# Patient Record
Sex: Male | Born: 1985 | Hispanic: No | Marital: Single | Smoking: Current every day smoker
Health system: Southern US, Community
[De-identification: ages and names within clinical notes are randomized; demographics above are authoritative.]

## PROBLEM LIST (undated history)

## (undated) HISTORY — PX: DENTAL SURGERY: SHX609

---

## 2016-11-11 ENCOUNTER — Encounter: Payer: Self-pay | Admitting: Emergency Medicine

## 2016-11-11 ENCOUNTER — Emergency Department: Payer: Self-pay

## 2016-11-11 ENCOUNTER — Emergency Department
Admission: EM | Admit: 2016-11-11 | Discharge: 2016-11-11 | Disposition: A | Discharge status: Home / Self Care | Payer: Self-pay | Attending: Emergency Medicine | Admitting: Emergency Medicine

## 2016-11-11 DIAGNOSIS — M25512 Pain in left shoulder: Secondary | ICD-10-CM

## 2016-11-11 DIAGNOSIS — Y92410 Unspecified street and highway as the place of occurrence of the external cause: Secondary | ICD-10-CM | POA: Insufficient documentation

## 2016-11-11 DIAGNOSIS — Y999 Unspecified external cause status: Secondary | ICD-10-CM | POA: Insufficient documentation

## 2016-11-11 DIAGNOSIS — F17210 Nicotine dependence, cigarettes, uncomplicated: Secondary | ICD-10-CM | POA: Insufficient documentation

## 2016-11-11 DIAGNOSIS — S46911A Strain of unspecified muscle, fascia and tendon at shoulder and upper arm level, right arm, initial encounter: Secondary | ICD-10-CM | POA: Insufficient documentation

## 2016-11-11 DIAGNOSIS — Y9355 Activity, bike riding: Secondary | ICD-10-CM | POA: Insufficient documentation

## 2016-11-11 MED ORDER — NAPROXEN 500 MG PO TABS
500.0000 mg | ORAL_TABLET | Freq: Once | ORAL | Status: AC
  Administered 2016-11-11: 500 mg via ORAL
  Filled 2016-11-11: qty 1

## 2016-11-11 MED ORDER — NAPROXEN 500 MG PO TABS: 500.0000 mg | ORAL_TABLET | Freq: Two times a day (BID) | ORAL | 0 refills | Status: AC

## 2016-11-11 NOTE — Discharge Instructions (Signed)
Your exam and x-ray are negative. You may have a shoulder strain and mild AC separation. Apply ice and take the prescription med as directed. Follow-up with Dr. Hyacinth MeekerMiller for continued symptoms.
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
## 2021-05-16 ENCOUNTER — Other Ambulatory Visit: Payer: Self-pay

## 2021-05-16 ENCOUNTER — Emergency Department: Payer: Medicaid Other

## 2021-05-16 ENCOUNTER — Encounter: Payer: Self-pay | Admitting: Emergency Medicine

## 2021-05-16 ENCOUNTER — Emergency Department
Admission: EM | Admit: 2021-05-16 | Discharge: 2021-05-16 | Disposition: A | Discharge status: Home / Self Care | Payer: Medicaid Other | Attending: Emergency Medicine | Admitting: Emergency Medicine

## 2021-05-16 DIAGNOSIS — W228XXA Striking against or struck by other objects, initial encounter: Secondary | ICD-10-CM | POA: Insufficient documentation

## 2021-05-16 DIAGNOSIS — S6992XA Unspecified injury of left wrist, hand and finger(s), initial encounter: Secondary | ICD-10-CM | POA: Diagnosis present

## 2021-05-16 DIAGNOSIS — S60222A Contusion of left hand, initial encounter: Secondary | ICD-10-CM | POA: Insufficient documentation

## 2021-05-16 NOTE — ED Provider Notes (Signed)
[facility] Provider Note    Event Date/Time   First MD Initiated Contact with Patient 05/16/21 1749     (approximate)   History   Hand Injury   HPI  Douglas Palmer is a 36 y.o. male presents emergency department complaining of a left hand injury.  Patient states that he punched the doorway 2 days ago.  Patient is complaining of left hand pain.  No numbness or tingling.      Physical Exam   Triage Vital Signs: ED Triage Vitals  Enc Vitals Group     BP 05/16/21 1511 122/68     Pulse Rate 05/16/21 1511 88     Resp 05/16/21 1511 16     Temp 05/16/21 1511 97.7 F (36.5 C)     Temp Source 05/16/21 1511 Oral     SpO2 05/16/21 1511 100 %     Weight 05/16/21 1509 149 lb 0.5 oz (67.6 kg)     Height 05/16/21 1509 5\' 7"  (1.702 m)     Head Circumference --      Peak Flow --      Pain Score 05/16/21 1509 7     Pain Loc --      Pain Edu? --      Excl. in GC? --     Most recent vital signs: Vitals:   05/16/21 1511  BP: 122/68  Pulse: 88  Resp: 16  Temp: 97.7 F (36.5 C)  SpO2: 100%     General: Awake, no distress.   CV:  Good peripheral perfusion.   Resp:  Normal effort.   Abd:  No distention.   Other:  Left hand has some bruising and tenderness along the fourth and fifth metacarpals, neurovascular is intact   ED Results / Procedures / Treatments   Labs (all labs ordered are listed, but only abnormal results are displayed) Labs Reviewed - No data to display   EKG     RADIOLOGY  X-ray of the left hand   PROCEDURES:  Critical Care performed: No  Procedures   MEDICATIONS ORDERED IN ED: Medications - No data to display   IMPRESSION / MDM / ASSESSMENT AND PLAN / ED COURSE  I reviewed the triage vital signs and the nursing notes.                              Differential diagnosis includes, but is not limited to, boxer's fracture, left hand contusion, dislocation  Patient is a 36 year old male presents emergency  department left hand injury after punching a door frame.  X-ray of the left hand does not show a fracture.  This was confirmed by radiology  Due to the area just being bruised, the patient can just apply ice to the left hand.  Take over-the-counter Tylenol or ibuprofen.  Asked for a work note which was provided.  He is to follow-up with orthopedics if not improving in 1 week.  Return if worsening.  Discharged in stable condition.          FINAL CLINICAL IMPRESSION(S) / ED DIAGNOSES   Final diagnoses:  Contusion of left hand, initial encounter     Rx / DC Orders   ED Discharge Orders     None        Note:  This document was prepared using Dragon voice recognition software and may include unintentional dictation errors.    31, PA-C 05/16/21 1757  Georga Hacking, MD 05/16/21 Jerene Bears

## 2021-05-16 NOTE — Discharge Instructions (Signed)
Follow-up with [facility] orthopedics if not improving in 1 week.  Please call for appointment.  Apply ice to the left hand.  Take Tylenol or ibuprofen for pain as needed.  Return if worsening

## 2021-05-16 NOTE — ED Triage Notes (Signed)
Punched door frame.  C/O left hand pain.

## 2022-05-02 ENCOUNTER — Emergency Department
Admission: EM | Admit: 2022-05-02 | Discharge: 2022-05-02 | Disposition: A | Discharge status: Home / Self Care | Payer: Medicaid Other | Attending: Emergency Medicine | Admitting: Emergency Medicine

## 2022-05-02 ENCOUNTER — Other Ambulatory Visit: Payer: Self-pay

## 2022-05-02 ENCOUNTER — Emergency Department: Payer: Medicaid Other

## 2022-05-02 DIAGNOSIS — M7918 Myalgia, other site: Secondary | ICD-10-CM

## 2022-05-02 DIAGNOSIS — M25551 Pain in right hip: Secondary | ICD-10-CM | POA: Insufficient documentation
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
## 2022-05-02 NOTE — Discharge Instructions (Signed)
Take acetaminophen 650 mg and ibuprofen 400 mg every 6 hours for pain.  Take with food. Call Dr Hyacinth Meeker if hip pain persists.   Thank you for choosing [place] for your health care today!  Please see your primary doctor this week for a follow up appointment.   Sometimes, in the early stages of certain disease courses it is difficult to detect in the emergency department evaluation -- so, it is important that you continue to monitor your symptoms and call your doctor right away or return to the emergency department if you develop any new or worsening symptoms.  Please go to the following website to schedule new (and existing) patient appointments:   [email or web address]  If you do not have a primary doctor try calling the following clinics to establish care:  If you have insurance:  [facility] [id number] [address] [place] [place] [address]   [facility]  [phone] [address] [place] [place] [address]   If you do not have insurance:  [facility]  [phone] [address], [place] [address]   The following is another list of primary care offices in the area who are accepting new patients at this time.  Please reach out to one of them directly and let them know you would like to schedule an appointment to follow up on an Emergency Department visit, and/or to establish a new primary care provider (PCP).  There are likely other primary care clinics in the are who are accepting new patients, but this is an excellent place to start:  [facility] Lead physician: Dr Shirlee Latch [address] [place], [place] [address] [phone]  [facility] Lead Physician: Dr Alba Cory [address], [place], [place] [address] [phone]  [facility]  Lead Physician: Dr Olevia Perches [address] [place], [place] [address] [phone]  [facility] Lead Physician: Dr Sofie Hartigan [address] [place], [place], [place] [address] [phone]  [facility] at [facility] Lead Physician: Dr Bari Edward [address] [place], [place] [address] [phone]   It was my pleasure to care for you today.   Daneil Dan Modesto Charon, MD

## 2022-06-18 ENCOUNTER — Other Ambulatory Visit: Payer: Self-pay

## 2022-06-18 ENCOUNTER — Emergency Department
Admission: EM | Admit: 2022-06-18 | Discharge: 2022-06-18 | Disposition: A | Discharge status: Home / Self Care | Payer: Self-pay | Attending: Emergency Medicine | Admitting: Emergency Medicine

## 2022-06-18 DIAGNOSIS — K029 Dental caries, unspecified: Secondary | ICD-10-CM | POA: Insufficient documentation

## 2022-06-18 MED ORDER — AMOXICILLIN-POT CLAVULANATE 875-125 MG PO TABS
1.0000 | ORAL_TABLET | Freq: Once | ORAL | Status: AC
  Administered 2022-06-18: 1 via ORAL
  Filled 2022-06-18: qty 1

## 2022-06-18 MED ORDER — AMOXICILLIN-POT CLAVULANATE 875-125 MG PO TABS: 1.0000 | ORAL_TABLET | Freq: Two times a day (BID) | ORAL | 0 refills | Status: AC

## 2022-06-18 MED ORDER — LIDOCAINE VISCOUS HCL 2 % MT SOLN
15.0000 mL | Freq: Once | OROMUCOSAL | Status: AC
  Administered 2022-06-18: 15 mL via OROMUCOSAL
  Filled 2022-06-18: qty 15

## 2022-06-18 MED ORDER — TRAMADOL HCL 50 MG PO TABS
50.0000 mg | ORAL_TABLET | Freq: Once | ORAL | Status: AC
  Administered 2022-06-18: 50 mg via ORAL
  Filled 2022-06-18: qty 1

## 2022-06-18 NOTE — ED Triage Notes (Signed)
Pt presents to ED with c/o dental pain for the past 3 days. Pt states he broke a tooth on the L side. Pt denies fever or chills.

## 2022-06-18 NOTE — ED Provider Notes (Signed)
[facility] Emergency Department Provider Note     Event Date/Time   First MD Initiated Contact with Patient 06/18/22 1252     (approximate)   History   Dental Pain   HPI  Douglas Palmer is a 37 y.o. male with no medical history, presents to the ED for evaluation of 3 days of dental pain.  Patient with admitted poor dentition and [place] care use generally through the upper and lower jaw, presents noting pain localized to the anterior lower incisors.  Patient also notes some tenderness to his chin.  He denies any superficial or skin lesions.  He also denies any fevers or chills.  He denies any spontaneous purulent drainage.   Physical Exam   Triage Vital Signs: ED Triage Vitals  Enc Vitals Group     BP 06/18/22 1217 (!) 123/98     Pulse Rate 06/18/22 1217 (!) 109     Resp 06/18/22 1217 17     Temp 06/18/22 1217 (!) 97.5 F (36.4 C)     Temp Source 06/18/22 1217 Oral     SpO2 06/18/22 1217 99 %     Weight 06/18/22 1248 149 lb 14.6 oz (68 kg)     Height 06/18/22 1248 5' 8"$  (1.727 m)     Head Circumference --      Peak Flow --      Pain Score 06/18/22 1212 10     Pain Loc --      Pain Edu? --      Excl. in [place]? --     Most recent vital signs: Vitals:   06/18/22 1217  BP: (!) 123/98  Pulse: (!) 109  Resp: 17  Temp: (!) 97.5 F (36.4 C)  SpO2: 99%    General Awake, no distress. NAD HEENT NCAT. PERRL. EOMI. No rhinorrhea. Mucous membranes are moist.  Uvula is midline and tonsils are flat.  Patient with generalized arrested caries, necrotic gingivitis, and multiple teeth with decay to the dentin.  No brawny sublingual edema is appreciated.  No focal gum swelling or point tenderness noted.  Firmness noted to the chin without appreciable fluctuance or buccal mucosa fluctuance appreciated. CV:  Good peripheral perfusion.  RESP:  Normal effort.  ABD:  No distention.    ED Results / Procedures / Treatments   Labs (all labs ordered are  listed, but only abnormal results are displayed) Labs Reviewed - No data to display   EKG   RADIOLOGY  No results found.   PROCEDURES:  Critical Care performed: No  Procedures   MEDICATIONS ORDERED IN ED: Medications  lidocaine (XYLOCAINE) 2 % viscous mouth solution 15 mL (15 mLs Mouth/Throat Given 06/18/22 1326)  amoxicillin-clavulanate (AUGMENTIN) 875-125 MG per tablet 1 tablet (1 tablet Oral Given 06/18/22 1340)  traMADol (ULTRAM) tablet 50 mg (50 mg Oral Given 06/18/22 1326)     IMPRESSION / MDM / ASSESSMENT AND PLAN / ED COURSE  I reviewed the triage vital signs and the nursing notes.                              Differential diagnosis includes, but is not limited to, dental caries, dental infection, dental abscess, facial abscess, Ludwig's angina  Patient's presentation is most consistent with acute complicated illness / injury requiring diagnostic workup.  Patient's diagnosis is consistent with dental infection secondary to dental caries. Patient will be discharged home with prescriptions for Augmentin. Patient  is to follow up with a local dental provider as needed or otherwise directed. Patient is given ED precautions to return to the ED for any worsening or new symptoms.     FINAL CLINICAL IMPRESSION(S) / ED DIAGNOSES   Final diagnoses:  Pain due to dental caries     Rx / DC Orders   ED Discharge Orders          Ordered    amoxicillin-clavulanate (AUGMENTIN) 875-125 MG tablet  2 times daily        06/18/22 1309             Note:  This document was prepared using Dragon voice recognition software and may include unintentional dictation errors.    Melvenia Needles, PA-C 06/18/22 1919    Lucillie Garfinkel, MD 06/19/22 [phone]

## 2022-06-18 NOTE — Discharge Instructions (Signed)
Antibiotic as directed until all pills are completed.  Follow-up with one of the listed dental providers for further treatment including dental extractions.  OPTIONS FOR DENTAL FOLLOW UP CARE  [email or web address] [phone])  [facility] [phone])  [place] [phone])  [place] [phone])  [facility] [phone]) This clinic caters to the indigent population and is on a lottery system. Location: [employer] [facility], [place], [place], [place] [facility] Hours: Wednesdays from 6pm - 9pm, patients seen by a lottery system. For dates, call or go to [email or web address] Services: Cleanings, fillings and simple extractions. Payment Options: DENTAL WORK IS FREE OF CHARGE. Bring proof of income or support. Best way to get seen: Arrive at 5:15 pm - this is a lottery, NOT first come/first serve, so arriving earlier will not increase your chances of being seen.     [facility] [phone] Select option 1 for emergencies   Location: [facility], [place], [address] [place] Clinic Hours: No walk-ins accepted - call the day before to schedule an appointment. Check in times are 9:30 am and 1:30 pm. Services: Simple extractions, temporary fillings, pulpectomy/pulp debridement, uncomplicated abscess drainage. Payment Options: PAYMENT IS DUE AT THE TIME OF SERVICE.  Fee is usually $100-200, additional surgical procedures (e.g. abscess drainage) may be extra. Cash, checks, Visa/MasterCard accepted.  Can file Medicaid if patient is covered for dental - patient should call case worker to check. No discount for [facility] patients. Best way to get seen: MUST call the  day before and get onto the schedule. Can usually be seen the next 1-2 days. No walk-ins accepted.     [place] [phone]   Location: [place], [place] [facility] Hours: M, W, Th, F 8am or 1:30pm, Tues 9a or 1:30 - first come/first served. Services: Simple extractions, temporary fillings, uncomplicated abscess drainage.  You do not need to be an [facility] resident. Payment Options: PAYMENT IS DUE AT THE TIME OF SERVICE. Dental insurance, otherwise sliding scale - bring proof of income or support. Depending on income and treatment needed, cost is usually $50-200. Best way to get seen: Arrive early as it is first come/first served.     [facility] [phone]   Location: [place] [facility] Hours: Mon-Thu 8a-5p Services: Most basic dental services including extractions and fillings. Payment Options: PAYMENT IS DUE AT THE TIME OF SERVICE. Sliding scale, up to 50% off - bring proof if income or support. Medicaid with dental option accepted. Best way to get seen: Call to schedule an appointment, can usually be seen within 2 weeks OR they will try to see walk-ins - show up at [place] or 2p (you may have to wait).     [facility] RESIDENTS ONLY   Location: [facility], [place] [address] [place], [place] [address] Clinic Hours: By appointment only. Monday - Thursday 8am-5pm, Friday 8am-12pm Services: Cleanings, fillings, extractions. Payment Options: PAYMENT IS DUE AT THE TIME OF SERVICE. Cash, Visa or MasterCard. Sliding scale - $30 minimum per service. Best way to get seen: Come in to office, complete packet and make an appointment - need proof of income or support monies for each household member and proof of [facility] residence.  Usually takes about a month to get in.     [facility] [phone]   Location: [address] [place] Clinic Hours: Walk-in Urgent Care Dental Services are offered Monday-Friday mornings only. The numbers of emergencies accepted daily is limited to the number of providers available. Maximum 15 - Mondays, Wednesdays & Thursdays Maximum 10 - Tuesdays & Fridays Services: You do not need to be a [facility] resident to be seen for a dental emergency. Emergencies are defined as pain, swelling, abnormal bleeding, or dental trauma. Walkins will receive x-rays if needed. NOTE: Dental cleaning is not an emergency. Payment Options: PAYMENT IS DUE AT THE TIME OF SERVICE. Minimum co-pay is $40.00 for uninsured patients. Minimum co-pay is $3.00 for Medicaid with dental coverage. Dental Insurance is accepted and must be presented at time of visit. Medicare does not cover dental. Forms of payment: Cash, credit card, checks. Best way to get seen: If not previously registered with the clinic, walk-in dental registration begins at 7:15 am and is on a first come/first serve basis. If previously registered with the clinic, call to make an appointment.     [facility] [place] ONLY   Location: [address] [place], [place] Clinic Hours: Mon-Thu 10a-2p Services: Extractions only! Payment Options: FREE (donations accepted) - bring proof of income or support Best way to get seen: Call and schedule an appointment OR come at 8am on the 1st Monday of every month (except for holidays) when it is first come/first served.     [facility] [phone]   Location: [place], [place] [facility] Hours: Friday mornings Services, Payment Options, Best way to get seen: Call for info

## 2022-12-21 ENCOUNTER — Emergency Department
Admission: EM | Admit: 2022-12-21 | Discharge: 2022-12-21 | Disposition: A | Discharge status: Home / Self Care | Payer: 59 | Attending: Emergency Medicine | Admitting: Emergency Medicine

## 2022-12-21 ENCOUNTER — Other Ambulatory Visit: Payer: Self-pay

## 2022-12-21 ENCOUNTER — Encounter: Payer: Self-pay | Admitting: Emergency Medicine

## 2022-12-21 DIAGNOSIS — K029 Dental caries, unspecified: Secondary | ICD-10-CM | POA: Insufficient documentation

## 2022-12-21 DIAGNOSIS — K047 Periapical abscess without sinus: Secondary | ICD-10-CM | POA: Diagnosis not present

## 2022-12-21 DIAGNOSIS — K0889 Other specified disorders of teeth and supporting structures: Secondary | ICD-10-CM | POA: Diagnosis not present

## 2022-12-21 MED ORDER — HYDROCODONE-ACETAMINOPHEN 5-325 MG PO TABS: 2.0000 | ORAL_TABLET | Freq: Four times a day (QID) | ORAL | 0 refills | Status: AC | PRN

## 2022-12-21 MED ORDER — IBUPROFEN 800 MG PO TABS
800.0000 mg | ORAL_TABLET | Freq: Once | ORAL | Status: DC
  Filled 2022-12-21: qty 1

## 2022-12-21 MED ORDER — AMOXICILLIN-POT CLAVULANATE 875-125 MG PO TABS: 1.0000 | ORAL_TABLET | Freq: Two times a day (BID) | ORAL | 0 refills | Status: AC

## 2022-12-21 MED ORDER — ONDANSETRON 4 MG PO TBDP
4.0000 mg | ORAL_TABLET | Freq: Once | ORAL | Status: AC
  Administered 2022-12-21: 4 mg via ORAL
  Filled 2022-12-21: qty 1

## 2022-12-21 MED ORDER — HYDROCODONE-ACETAMINOPHEN 5-325 MG PO TABS
2.0000 | ORAL_TABLET | Freq: Once | ORAL | Status: AC
  Administered 2022-12-21: 2 via ORAL
  Filled 2022-12-21: qty 2

## 2022-12-21 MED ORDER — ONDANSETRON 4 MG PO TBDP: 4.0000 mg | ORAL_TABLET | Freq: Four times a day (QID) | ORAL | 0 refills | Status: AC | PRN

## 2022-12-21 MED ORDER — IBUPROFEN 800 MG PO TABS: 800.0000 mg | ORAL_TABLET | Freq: Three times a day (TID) | ORAL | 0 refills | Status: AC | PRN

## 2022-12-21 MED ORDER — AMOXICILLIN-POT CLAVULANATE 875-125 MG PO TABS
1.0000 | ORAL_TABLET | Freq: Once | ORAL | Status: AC
  Administered 2022-12-21: 1 via ORAL
  Filled 2022-12-21: qty 1

## 2022-12-21 NOTE — ED Triage Notes (Signed)
Pt c/o L side dental pain since Friday, pt has hx of dental abscess.

## 2022-12-21 NOTE — Discharge Instructions (Addendum)

## 2022-12-21 NOTE — ED Provider Notes (Signed)
[facility] Provider Note    Event Date/Time   First MD Initiated Contact with Patient 12/21/22 0120     (approximate)   History   Dental Pain   HPI  Douglas Palmer is a 37 y.o. male with no significant past medical history who presents to the emergency department with diffuse left lower dental pain and lower facial swelling.  No fever.  Does not have a dentist.  Is able to eat and drink without difficulty.  History provided by patient.    History reviewed. No pertinent past medical history.  Past Surgical History:  Procedure Laterality Date   DENTAL SURGERY      MEDICATIONS:  Prior to Admission medications   Not on File    Physical Exam   Triage Vital Signs: ED Triage Vitals  Encounter Vitals Group     BP 12/21/22 0047 (!) 138/98     Systolic BP Percentile --      Diastolic BP Percentile --      Pulse Rate 12/21/22 0047 68     Resp 12/21/22 0047 18     Temp 12/21/22 0047 98.6 F (37 C)     Temp Source 12/21/22 0047 Oral     SpO2 12/21/22 0047 100 %     Weight 12/21/22 0044 149 lb (67.6 kg)     Height 12/21/22 0044 5\' 8"  (1.727 m)     Head Circumference --      Peak Flow --      Pain Score 12/21/22 0043 10     Pain Loc --      Pain Education --      Exclude from Growth Chart --     Most recent vital signs: Vitals:   12/21/22 0047  BP: (!) 138/98  Pulse: 68  Resp: 18  Temp: 98.6 F (37 C)  SpO2: 100%     CONSTITUTIONAL: Alert and responds appropriately to questions.  Chronically ill-appearing and appears older than stated age HEAD: Normocephalic, atraumatic EYES: Conjunctivae clear, pupils appear equal ENT: Patient has significant decay of all of his teeth.  There is no gum swelling or obvious drainable abscess on exam.  He has no trismus, drooling and has normal phonation.  His tongue sits flat in the bottom of his mouth.  No cervical lymphadenopathy present.  He does have a little bit of swelling just underneath the  left lower lip around the chin but there is no facial erythema or warmth noted. NECK: Normal range of motion CARD: Regular rate and rhythm RESP: Normal chest excursion without splinting or tachypnea; no hypoxia or respiratory distress, speaking full sentences ABD/GI: non-distended EXT: Normal ROM in all joints, no major deformities noted SKIN: Normal color for age and race, no rashes on exposed skin NEURO: Moves all extremities equally, normal speech, no facial asymmetry noted PSYCH: The patient's mood and manner are appropriate. Grooming and personal hygiene are appropriate.  ED Results / Procedures / Treatments   LABS: (all labs ordered are listed, but only abnormal results are displayed) Labs Reviewed - No data to display   EKG:  EKG Interpretation Date/Time:    Ventricular Rate:    PR Interval:    QRS Duration:    QT Interval:    QTC Calculation:   R Axis:      Text Interpretation:            RADIOLOGY: My personal review and interpretation of imaging:    I have personally reviewed all  radiology reports. No results found.   PROCEDURES:  Critical Care performed: No   CRITICAL CARE Performed by: Rochele Raring   Total critical care time: 0 minutes  Critical care time was exclusive of separately billable procedures and treating other patients.  Critical care was necessary to treat or prevent imminent or life-threatening deterioration.  Critical care was time spent personally by me on the following activities: development of treatment plan with patient and/or surrogate as well as nursing, discussions with consultants, evaluation of patient's response to treatment, examination of patient, obtaining history from patient or surrogate, ordering and performing treatments and interventions, ordering and review of laboratory studies, ordering and review of radiographic studies, pulse oximetry and re-evaluation of patient's condition.   Procedures    IMPRESSION /  MDM / ASSESSMENT AND PLAN / ED COURSE  I reviewed the triage vital signs and the nursing notes.   Patient here with dental pain from dental caries and likely developing dental abscess.    DIFFERENTIAL DIAGNOSIS (includes but not limited to):   Dental pain from dental caries, dental abscess, no sign of Ludwigs angina, facial cellulitis  Patient's presentation is most consistent with acute complicated illness / injury requiring diagnostic workup.  PLAN: Patient here with likely developing dental abscess.  There is nothing that I can drain on exam today.  He has significant decay of all of his teeth.  Will start him on Augmentin and discharged with short course of narcotic analgesia.  Will give dental follow-up.  Patient's airway is patent.  He does not appear toxic today.  No sign of airway involvement, Ludwigs.   MEDICATIONS GIVEN IN ED: Medications  HYDROcodone-acetaminophen (NORCO/VICODIN) 5-325 MG per tablet 2 tablet (2 tablets Oral Given 12/21/22 0153)  ondansetron (ZOFRAN-ODT) disintegrating tablet 4 mg (4 mg Oral Given 12/21/22 0153)  amoxicillin-clavulanate (AUGMENTIN) 875-125 MG per tablet 1 tablet (1 tablet Oral Given 12/21/22 0154)     ED COURSE:  At this time, I do not feel there is any life-threatening condition present. I reviewed all nursing notes, vitals, pertinent previous records.  All lab and urine results, EKGs, imaging ordered have been independently reviewed and interpreted by myself.  I reviewed all available radiology reports from any imaging ordered this visit.  Based on my assessment, I feel the patient is safe to be discharged home without further emergent workup and can continue workup as an outpatient as needed. Discussed all findings, treatment plan as well as usual and customary return precautions.  They verbalize understanding and are comfortable with this plan.  Outpatient follow-up has been provided as needed.  All questions have been answered.    CONSULTS:  None   OUTSIDE RECORDS REVIEWED: No previous records for review.     FINAL CLINICAL IMPRESSION(S) / ED DIAGNOSES   Final diagnoses:  Pain due to dental caries  Dental abscess     Rx / DC Orders   ED Discharge Orders          Ordered    amoxicillin-clavulanate (AUGMENTIN) 875-125 MG tablet  2 times daily        12/21/22 0148    HYDROcodone-acetaminophen (NORCO/VICODIN) 5-325 MG tablet  Every 6 hours PRN        12/21/22 0148    ibuprofen (ADVIL) 800 MG tablet  Every 8 hours PRN        12/21/22 0148    ondansetron (ZOFRAN-ODT) 4 MG disintegrating tablet  Every 6 hours PRN        12/21/22 0148  Note:  This document was prepared using Dragon voice recognition software and may include unintentional dictation errors.   Sheikh Leverich, Layla Maw, DO 12/21/22 1112

## 2023-06-19 IMAGING — CR DG HAND COMPLETE 3+V*L*
3 series · 3 of 3 positions shown · non-contrast
Comparison: None.

CLINICAL DATA: Left hand pain after punching door

EXAM:
LEFT HAND - COMPLETE 3+ VIEW

[hand ap]
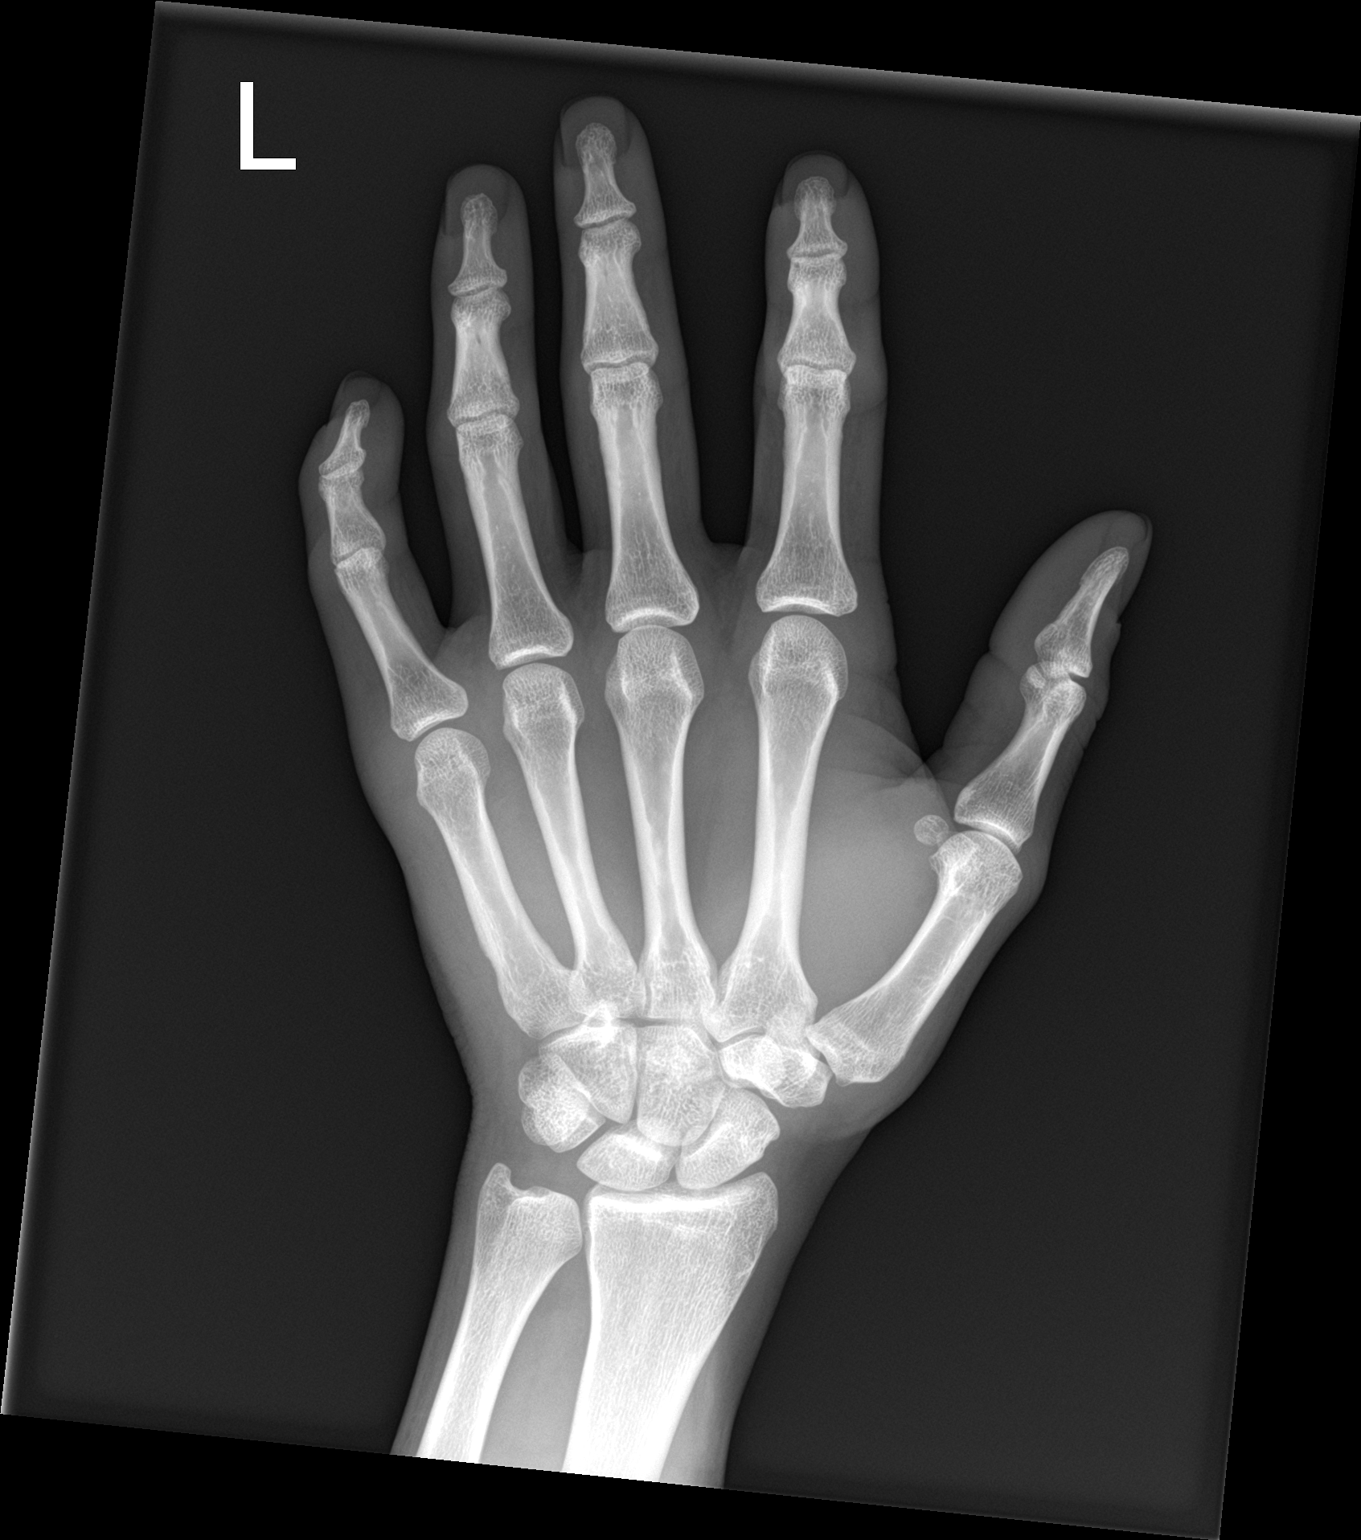

[hand obl]
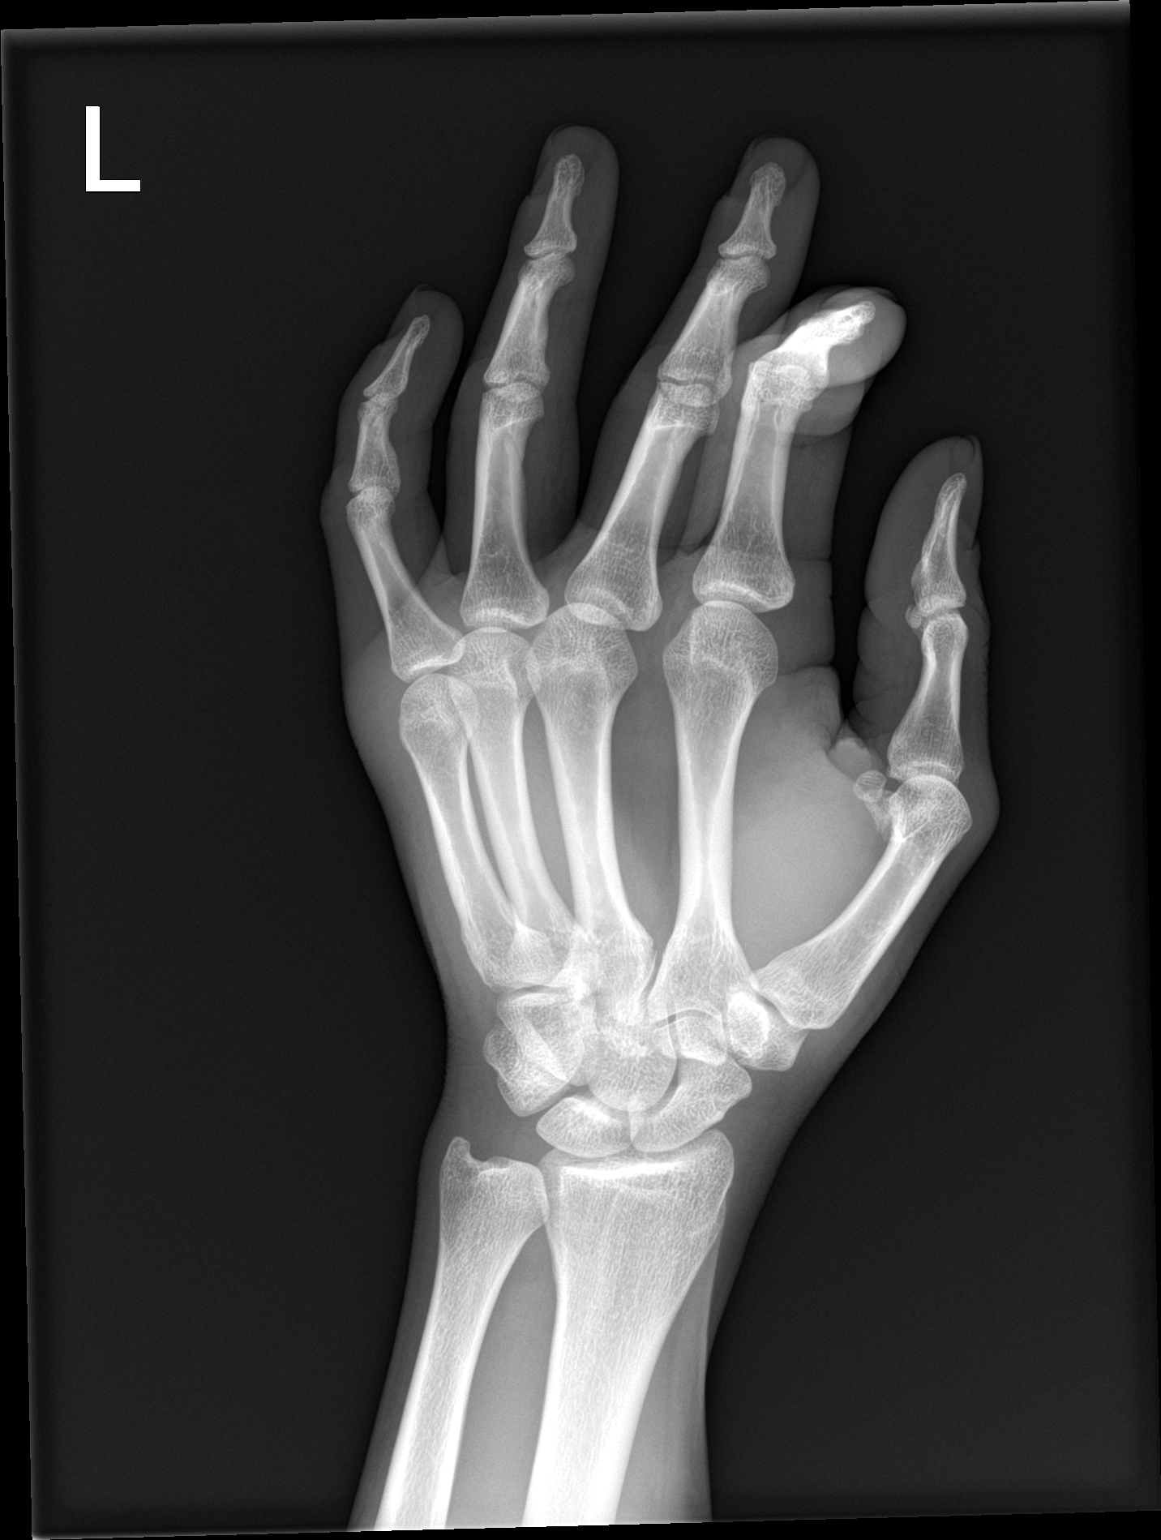

[hand lat]
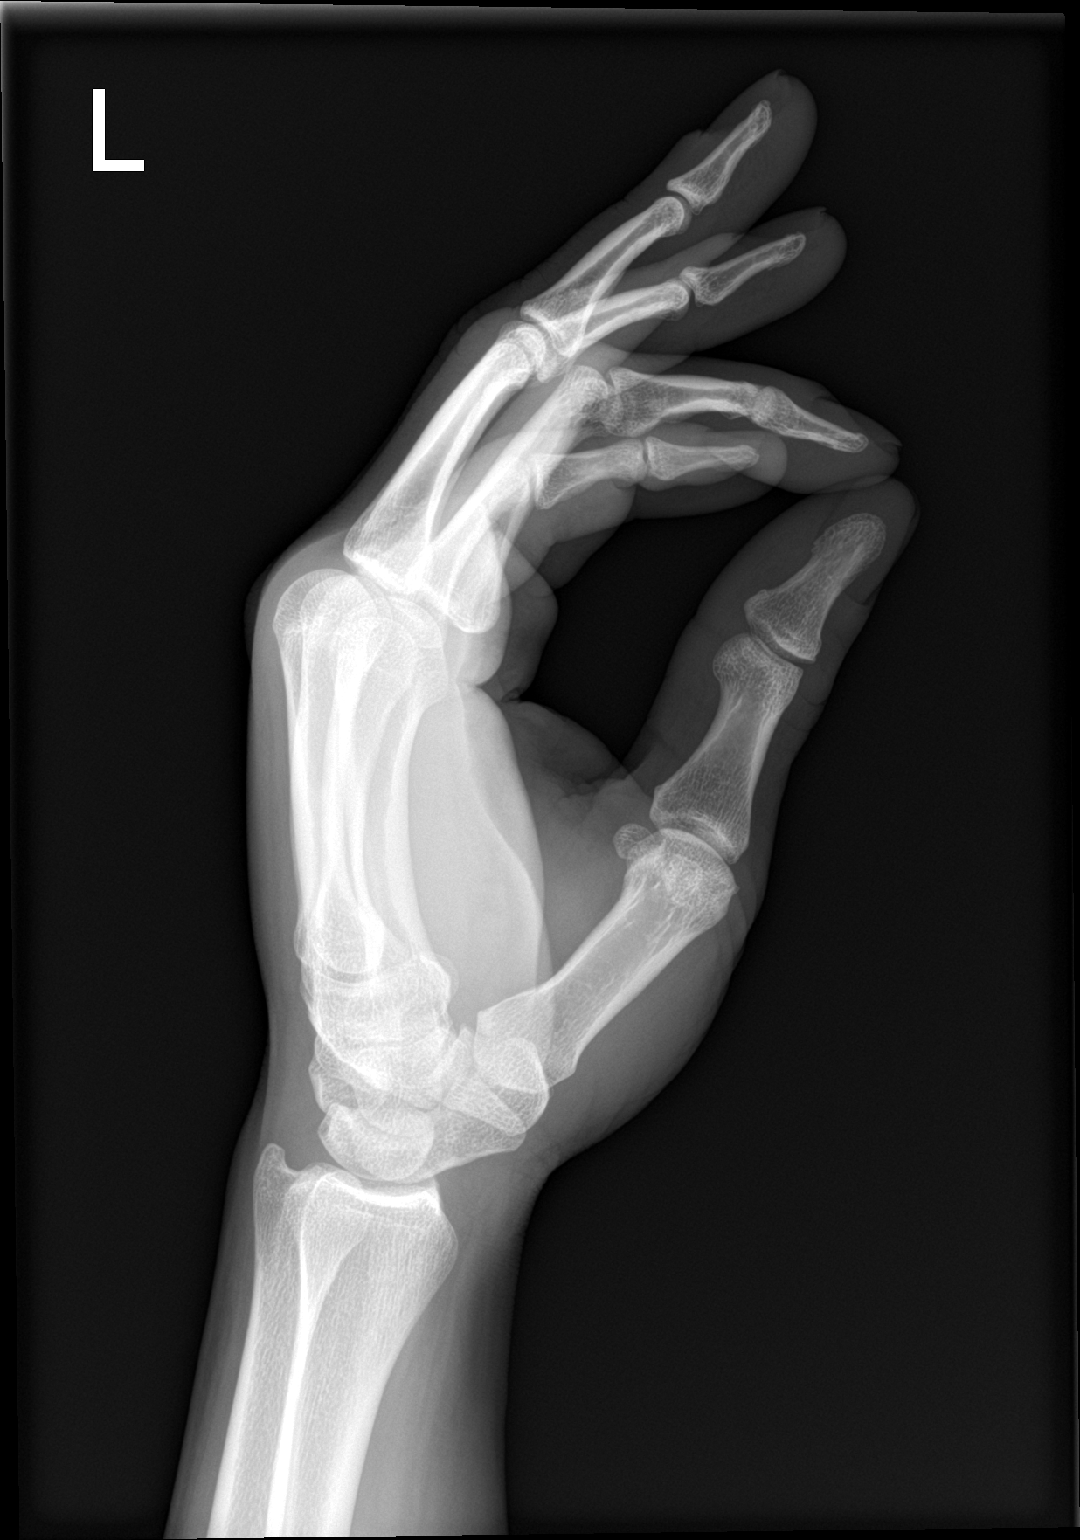

[3 of 3 positions shown; findings below may reference images not displayed]

FINDINGS: There is no evidence of fracture or dislocation. There is no
evidence of arthropathy or other focal bone abnormality. Soft
tissues are unremarkable.
IMPRESSION: Negative.
